# Patient Record
Sex: Male | Born: 2000 | Race: Black or African American | Hispanic: No | Marital: Single | Smoking: Never smoker
Health system: Southern US, Community
[De-identification: ages and names within clinical notes are randomized; demographics above are authoritative.]

## PROBLEM LIST (undated history)

## (undated) DIAGNOSIS — K5900 Constipation, unspecified: Secondary | ICD-10-CM

## (undated) DIAGNOSIS — J45909 Unspecified asthma, uncomplicated: Secondary | ICD-10-CM

## (undated) DIAGNOSIS — R6250 Unspecified lack of expected normal physiological development in childhood: Secondary | ICD-10-CM

## (undated) DIAGNOSIS — N3944 Nocturnal enuresis: Secondary | ICD-10-CM

## (undated) DIAGNOSIS — IMO0002 Reserved for concepts with insufficient information to code with codable children: Secondary | ICD-10-CM

## (undated) DIAGNOSIS — F909 Attention-deficit hyperactivity disorder, unspecified type: Secondary | ICD-10-CM

## (undated) DIAGNOSIS — Z87898 Personal history of other specified conditions: Secondary | ICD-10-CM

## (undated) DIAGNOSIS — Z982 Presence of cerebrospinal fluid drainage device: Secondary | ICD-10-CM

## (undated) DIAGNOSIS — H5010 Unspecified exotropia: Secondary | ICD-10-CM

## (undated) DIAGNOSIS — F840 Autistic disorder: Secondary | ICD-10-CM

## (undated) HISTORY — PX: STRABISMUS SURGERY: SHX218

## (undated) HISTORY — PX: SHUNT REVISION: SHX343

## (undated) HISTORY — PX: VENTRICULOPERITONEAL SHUNT: SHX204

---
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
## 2021-09-17 ENCOUNTER — Other Ambulatory Visit (HOSPITAL_COMMUNITY): Payer: Self-pay | Admitting: Neurosurgery

## 2021-09-17 ENCOUNTER — Other Ambulatory Visit: Payer: Self-pay | Admitting: Neurosurgery

## 2021-09-17 DIAGNOSIS — G918 Other hydrocephalus: Secondary | ICD-10-CM

## 2021-09-23 ENCOUNTER — Ambulatory Visit (HOSPITAL_COMMUNITY)
Admission: RE | Admit: 2021-09-23 | Discharge: 2021-09-23 | Disposition: A | Discharge status: Home / Self Care | Payer: 59 | Source: Ambulatory Visit | Attending: Neurosurgery | Admitting: Neurosurgery

## 2021-09-23 DIAGNOSIS — G918 Other hydrocephalus: Secondary | ICD-10-CM | POA: Insufficient documentation
